# Patient Record
Sex: Male | Born: 1985 | Race: White | Hispanic: No | Marital: Married | Smoking: Never smoker
Health system: Southern US, Community
[De-identification: ages and names within clinical notes are randomized; demographics above are authoritative.]

---

## 2013-12-11 ENCOUNTER — Emergency Department (HOSPITAL_BASED_OUTPATIENT_CLINIC_OR_DEPARTMENT_OTHER)
Admission: EM | Admit: 2013-12-11 | Discharge: 2013-12-11 | Disposition: A | Discharge status: Home / Self Care | Payer: 59 | Attending: Emergency Medicine | Admitting: Emergency Medicine

## 2013-12-11 ENCOUNTER — Encounter (HOSPITAL_BASED_OUTPATIENT_CLINIC_OR_DEPARTMENT_OTHER): Payer: Self-pay | Admitting: Emergency Medicine

## 2013-12-11 DIAGNOSIS — S39012A Strain of muscle, fascia and tendon of lower back, initial encounter: Secondary | ICD-10-CM

## 2013-12-11 DIAGNOSIS — IMO0002 Reserved for concepts with insufficient information to code with codable children: Secondary | ICD-10-CM | POA: Insufficient documentation

## 2013-12-11 DIAGNOSIS — Y929 Unspecified place or not applicable: Secondary | ICD-10-CM | POA: Insufficient documentation

## 2013-12-11 DIAGNOSIS — X500XXA Overexertion from strenuous movement or load, initial encounter: Secondary | ICD-10-CM | POA: Insufficient documentation

## 2013-12-11 DIAGNOSIS — Y9389 Activity, other specified: Secondary | ICD-10-CM | POA: Insufficient documentation

## 2013-12-11 MED ORDER — ONDANSETRON 4 MG PO TBDP
4.0000 mg | ORAL_TABLET | Freq: Once | ORAL | Status: AC
  Administered 2013-12-11: 4 mg via ORAL
  Filled 2013-12-11: qty 1

## 2013-12-11 MED ORDER — HYDROMORPHONE HCL PF 2 MG/ML IJ SOLN
2.0000 mg | Freq: Once | INTRAMUSCULAR | Status: AC
  Administered 2013-12-11: 2 mg via INTRAMUSCULAR
  Filled 2013-12-11: qty 1

## 2013-12-11 MED ORDER — OXYCODONE-ACETAMINOPHEN 5-325 MG PO TABS: 2.0000 | ORAL_TABLET | ORAL | Status: DC | PRN

## 2013-12-11 MED ORDER — IBUPROFEN 800 MG PO TABS: 800.0000 mg | ORAL_TABLET | Freq: Three times a day (TID) | ORAL | Status: AC

## 2013-12-11 MED ORDER — CYCLOBENZAPRINE HCL 10 MG PO TABS: 10.0000 mg | ORAL_TABLET | Freq: Two times a day (BID) | ORAL | Status: DC | PRN

## 2013-12-11 NOTE — ED Notes (Signed)
Pt amb to triage with slow, steady gait in nad. Pt reports he was bent over giving daughter a bath this am, and then couldn't straighten up, low back pain 6/10 increases to 10/10 with ambulation, radiating down left leg.

## 2013-12-11 NOTE — ED Provider Notes (Signed)
CSN: [id number]     Arrival date & time 12/11/13  1704 History   First MD Initiated Contact with Patient 12/11/13 1745     Chief Complaint  Patient presents with  . Back Pain   (Consider location/radiation/quality/duration/timing/severity/associated sxs/prior Treatment) HPI Comments: Patient presents with back pain. He states he was bending over to get his daughter a bath and had a sudden onset of pain in his left lower back. The pain is nonradiating. The triage nurse says it radiates down his left leg but he says it stays right in his back although it's worse with movement of his left leg. He denies any numbness or tingling in his leg. He denies any loss of bowel or bladder control. He denies any perineal numbness. He has had a history of similar problem in the past which resolved on its own after a few days. He states the pain is worse with walking and movement of the left leg.  Patient is a 27 y.o. male presenting with back pain.  Back Pain Associated symptoms: no abdominal pain, no chest pain, no fever, no headaches, no numbness and no weakness     History reviewed. No pertinent past medical history. History reviewed. No pertinent past surgical history. History reviewed. No pertinent family history. History  Substance Use Topics  . Smoking status: Never Smoker   . Smokeless tobacco: Not on file  . Alcohol Use: Not on file    Review of Systems  Constitutional: Negative for fever, chills, diaphoresis and fatigue.  HENT: Negative for congestion, rhinorrhea and sneezing.   Eyes: Negative.   Respiratory: Negative for cough, chest tightness and shortness of breath.   Cardiovascular: Negative for chest pain and leg swelling.  Gastrointestinal: Negative for nausea, vomiting, abdominal pain, diarrhea and blood in stool.  Genitourinary: Negative for frequency, hematuria, flank pain and difficulty urinating.  Musculoskeletal: Positive for back pain. Negative for arthralgias.  Skin:  Negative for rash.  Neurological: Negative for dizziness, speech difficulty, weakness, numbness and headaches.    Allergies  Review of patient's allergies indicates no known allergies.  Home Medications   Current Outpatient Rx  Name  Route  Sig  Dispense  Refill  . HYDROcodone-acetaminophen (NORCO/VICODIN) 5-325 MG per tablet   Oral   Take 1 tablet by mouth every 6 (six) hours as needed for moderate pain.         . cyclobenzaprine (FLEXERIL) 10 MG tablet   Oral   Take 1 tablet (10 mg total) by mouth 2 (two) times daily as needed for muscle spasms.   20 tablet   0   . ibuprofen (ADVIL,MOTRIN) 800 MG tablet   Oral   Take 1 tablet (800 mg total) by mouth 3 (three) times daily.   21 tablet   0   . oxyCODONE-acetaminophen (PERCOCET) 5-325 MG per tablet   Oral   Take 2 tablets by mouth every 4 (four) hours as needed.   20 tablet   0    BP 111/62  Pulse 79  Temp(Src) 98.2 F (36.8 C) (Oral)  Resp 16  Ht 6' (1.829 m)  Wt 150 lb (68.04 kg)  BMI 20.34 kg/m2  SpO2 100% Physical Exam  Constitutional: He is oriented to person, place, and time. He appears well-developed and well-nourished.  HENT:  Head: Normocephalic and atraumatic.  Eyes: Pupils are equal, round, and reactive to light.  Neck: Normal range of motion. Neck supple.  Cardiovascular: Normal rate, regular rhythm and normal heart sounds.   Pulmonary/Chest:  Effort normal and breath sounds normal. No respiratory distress. He has no wheezes. He has no rales. He exhibits no tenderness.  Abdominal: Soft. Bowel sounds are normal. There is no tenderness. There is no rebound and no guarding.  Musculoskeletal: Normal range of motion. He exhibits no edema.  Positive tenderness to the musculature of the left lower back. There's no pain along the spine. Negative straight leg raise bilaterally.   Lymphadenopathy:    He has no cervical adenopathy.  Neurological: He is alert and oriented to person, place, and time.  He has  normal sensation in both legs. Normal motor function in both legs.  Skin: Skin is warm and dry. No rash noted.  Psychiatric: He has a normal mood and affect.    ED Course  Procedures (including critical care time) Labs Review Labs Reviewed - No data to display Imaging Review No results found.  EKG Interpretation   None       MDM   1. Back strain, initial encounter    Patient presents with right-sided low back pain. It seems to be musculoskeletal in nature. There is no suggestions of renal colic. There's no neurologic deficits. He was discharged in good condition. He was given a shot of Dilaudid. He was given prescriptions for ibuprofen Flexeril and Percocet. He was encouraged to followup with Dr. Pearletha Forge who is seen in the past.    Rolan Bucco, MD 12/11/13 [phone]

## 2013-12-16 ENCOUNTER — Emergency Department (HOSPITAL_BASED_OUTPATIENT_CLINIC_OR_DEPARTMENT_OTHER)
Admission: EM | Admit: 2013-12-16 | Discharge: 2013-12-16 | Disposition: A | Discharge status: Home / Self Care | Payer: 59 | Attending: Emergency Medicine | Admitting: Emergency Medicine

## 2013-12-16 ENCOUNTER — Encounter (HOSPITAL_BASED_OUTPATIENT_CLINIC_OR_DEPARTMENT_OTHER): Payer: Self-pay | Admitting: Emergency Medicine

## 2013-12-16 DIAGNOSIS — Z791 Long term (current) use of non-steroidal anti-inflammatories (NSAID): Secondary | ICD-10-CM | POA: Insufficient documentation

## 2013-12-16 DIAGNOSIS — M545 Low back pain, unspecified: Secondary | ICD-10-CM | POA: Insufficient documentation

## 2013-12-16 DIAGNOSIS — M549 Dorsalgia, unspecified: Secondary | ICD-10-CM

## 2013-12-16 MED ORDER — KETOROLAC TROMETHAMINE 60 MG/2ML IM SOLN
60.0000 mg | Freq: Once | INTRAMUSCULAR | Status: AC
  Administered 2013-12-16: 60 mg via INTRAMUSCULAR
  Filled 2013-12-16: qty 2

## 2013-12-16 MED ORDER — CYCLOBENZAPRINE HCL 10 MG PO TABS
10.0000 mg | ORAL_TABLET | Freq: Once | ORAL | Status: AC
Start: 2013-12-16 — End: 2013-12-16
  Administered 2013-12-16: 10 mg via ORAL
  Filled 2013-12-16: qty 1

## 2013-12-16 NOTE — ED Notes (Signed)
Pt was observed ambulating down the hallway with a strong, steady gait with no limitations in movement when undressing for examination.

## 2013-12-16 NOTE — ED Notes (Signed)
Pt seen here on Tuesday for back pain and states its not getting any better even with meds. Pt ambulated to triage with a steady gait. Denies fever, injury, or urinary sxs.

## 2013-12-16 NOTE — ED Provider Notes (Signed)
CSN: [id number]     Arrival date & time 12/16/13  2030 History   First MD Initiated Contact with Patient 12/16/13 2124     Chief Complaint  Patient presents with  . Back Pain   (Consider location/radiation/quality/duration/timing/severity/associated sxs/prior Treatment) HPI Comments: Patient is a 27 year old male who presents with sudden onset of right lower back pain that started 5 days ago when he bent over. The pain is aching and severe and does not radiate. The pain is constant. Movement makes the pain worse. Patient was seen here 5 days ago after the injury occurred and was given pain medication. Pain medication makes the pain better. Pain is improving over the past few days but is still present. No associated symptoms. No saddle paresthesias or bladder/bowel incontinence. Patient is concerned about meningitis. Patient has Ortho follow up tomorrow morning.      History reviewed. No pertinent past medical history. History reviewed. No pertinent past surgical history. History reviewed. No pertinent family history. History  Substance Use Topics  . Smoking status: Never Smoker   . Smokeless tobacco: Not on file  . Alcohol Use: No    Review of Systems  Constitutional: Negative for fever, chills and fatigue.  HENT: Negative for trouble swallowing.   Eyes: Negative for visual disturbance.  Respiratory: Negative for shortness of breath.   Cardiovascular: Negative for chest pain and palpitations.  Gastrointestinal: Negative for nausea, vomiting, abdominal pain and diarrhea.  Genitourinary: Negative for dysuria and difficulty urinating.  Musculoskeletal: Positive for back pain. Negative for arthralgias and neck pain.  Skin: Negative for color change.  Neurological: Negative for dizziness and weakness.  Psychiatric/Behavioral: Negative for dysphoric mood.    Allergies  Review of patient's allergies indicates no known allergies.  Home Medications   Current Outpatient Rx  Name   Route  Sig  Dispense  Refill  . cyclobenzaprine (FLEXERIL) 10 MG tablet   Oral   Take 1 tablet (10 mg total) by mouth 2 (two) times daily as needed for muscle spasms.   20 tablet   0   . ibuprofen (ADVIL,MOTRIN) 800 MG tablet   Oral   Take 1 tablet (800 mg total) by mouth 3 (three) times daily.   21 tablet   0   . oxyCODONE-acetaminophen (PERCOCET) 5-325 MG per tablet   Oral   Take 2 tablets by mouth every 4 (four) hours as needed.   20 tablet   0   . HYDROcodone-acetaminophen (NORCO/VICODIN) 5-325 MG per tablet   Oral   Take 1 tablet by mouth every 6 (six) hours as needed for moderate pain.          BP 127/85  Pulse 80  Temp(Src) 98.4 F (36.9 C) (Oral)  Resp 16  SpO2 100% Physical Exam  Nursing note and vitals reviewed. Constitutional: He is oriented to person, place, and time. He appears well-developed and well-nourished. No distress.  HENT:  Head: Normocephalic and atraumatic.  Eyes: Conjunctivae and EOM are normal.  Neck: Normal range of motion.  Cardiovascular: Normal rate and regular rhythm.  Exam reveals no gallop and no friction rub.   No murmur heard. Pulmonary/Chest: Effort normal and breath sounds normal. He has no wheezes. He has no rales. He exhibits no tenderness.  Abdominal: Soft. He exhibits no distension. There is no tenderness. There is no rebound and no guarding.  Musculoskeletal: Normal range of motion.  No midline spine tenderness to palpation. Right lumbar paraspinal tenderness to palpation.   Neurological: He is  alert and oriented to person, place, and time. Coordination normal.  Patient ambulates without difficulty. Speech is goal-oriented. Moves limbs without ataxia.   Skin: Skin is warm and dry.  Psychiatric: He has a normal mood and affect. His behavior is normal.    ED Course  Procedures (including critical care time) Labs Review Labs Reviewed - No data to display Imaging Review No results found.  EKG Interpretation   None        MDM   1. Back pain     10:39 PM Patient having lumbar muscular pain. Patient is concerned about meningitis although he has no fever or infectious symptoms and had an injury that triggered the patient's pain. Patient has an Orthopedic follow up tomorrow morning. No midline spine tenderness. No bowel/bladder incontinence or saddle paresthesias.     Emilia Beck, PA-C 12/16/13 2340

## 2013-12-17 ENCOUNTER — Encounter: Payer: Self-pay | Admitting: Family Medicine

## 2013-12-17 ENCOUNTER — Ambulatory Visit (INDEPENDENT_AMBULATORY_CARE_PROVIDER_SITE_OTHER): Payer: 59 | Admitting: Family Medicine

## 2013-12-17 VITALS — BP 110/73 | HR 92 | Ht 72.0 in | Wt 150.0 lb

## 2013-12-17 DIAGNOSIS — M545 Low back pain, unspecified: Secondary | ICD-10-CM

## 2013-12-17 MED ORDER — PREDNISONE (PAK) 10 MG PO TABS: ORAL_TABLET | ORAL | Status: AC

## 2013-12-17 MED ORDER — OXYCODONE-ACETAMINOPHEN 5-325 MG PO TABS: 1.0000 | ORAL_TABLET | Freq: Four times a day (QID) | ORAL | Status: AC | PRN

## 2013-12-17 NOTE — ED Provider Notes (Signed)
Medical screening examination/treatment/procedure(s) were performed by non-physician practitioner and as supervising physician I was immediately available for consultation/collaboration.  EKG Interpretation   None         Kristen N Ward, DO 12/17/13 1227 

## 2013-12-17 NOTE — Patient Instructions (Signed)
Your history and exam are consistent with a disc herniation. A prednisone dose pack is the best option for immediate relief and may be prescribed. Day after finishing prednisone start aleve 2 tabs twice a day with food for pain and inflammation. Percocet as needed for severe pain (no driving on this medicine). Stay as active as possible. Physical therapy has been shown to be helpful as well - will consider in the future. Strengthening of low back muscles, abdominal musculature are key for long term pain relief. If not improving, will consider further imaging (MRI). If improving will add physical therapy. Follow up with me in 1 week.

## 2013-12-24 ENCOUNTER — Ambulatory Visit (INDEPENDENT_AMBULATORY_CARE_PROVIDER_SITE_OTHER): Payer: 59 | Admitting: Family Medicine

## 2013-12-24 ENCOUNTER — Ambulatory Visit (HOSPITAL_BASED_OUTPATIENT_CLINIC_OR_DEPARTMENT_OTHER)
Admission: RE | Admit: 2013-12-24 | Discharge: 2013-12-24 | Disposition: A | Discharge status: Home / Self Care | Payer: Managed Care, Other (non HMO) | Source: Ambulatory Visit | Attending: Family Medicine | Admitting: Family Medicine

## 2013-12-24 ENCOUNTER — Encounter: Payer: Self-pay | Admitting: Family Medicine

## 2013-12-24 VITALS — BP 116/71 | HR 88 | Ht 72.0 in | Wt 153.0 lb

## 2013-12-24 DIAGNOSIS — M545 Low back pain, unspecified: Secondary | ICD-10-CM | POA: Insufficient documentation

## 2013-12-24 NOTE — Assessment & Plan Note (Signed)
consistent with disc herniation but without radiculopathy.  Discussed options - he would like to try prednisone dose pack then switch to aleve.  Percocet as needed for severe pain.  Consider PT in future.  F/u in 1 week.  Consider MRI if not improving.

## 2013-12-24 NOTE — Progress Notes (Signed)
Patient ID: Frank Acevedo, male   DOB: 1986/07/19, 27 y.o.   MRN: [id number]  PCP: No PCP Per Patient  Subjective:   HPI: Patient is a 27 y.o. male here for low back pain.  Patient reports he was giving his daughter a bath last Tuesday. Went to get up and felt a sharp pain in low back. Had difficulty getting up after this. Had to crawl on all fours. Couple years ago had similar problem but only lasted a couple days. Has improved some since current injury. No radiation into legs. No bowel/bladder dysfunction. No numbness/tingling. Taking ibuprofen, flexeril, percocet. Iced first couple days. + night pain.  No past medical history on file.  Current Outpatient Prescriptions on File Prior to Visit  Medication Sig Dispense Refill  . ibuprofen (ADVIL,MOTRIN) 800 MG tablet Take 1 tablet (800 mg total) by mouth 3 (three) times daily.  21 tablet  0   No current facility-administered medications on file prior to visit.    No past surgical history on file.  No Known Allergies  History   Social History  . Marital Status: Divorced    Spouse Name: N/A    Number of Children: N/A  . Years of Education: N/A   Occupational History  . Not on file.   Social History Main Topics  . Smoking status: Never Smoker   . Smokeless tobacco: Not on file  . Alcohol Use: No  . Drug Use: No  . Sexual Activity: Not on file   Other Topics Concern  . Not on file   Social History Narrative  . No narrative on file    No family history on file.  BP 110/73  Pulse 92  Ht 6' (1.829 m)  Wt 150 lb (68.04 kg)  BMI 20.34 kg/m2  Review of Systems: See HPI above.    Objective:  Physical Exam:  Gen: NAD  Back: No gross deformity, scoliosis. No paraspinal TTP.  No midline or bony TTP. FROM. Strength LEs 5/5 all muscle groups.   2+ MSRs in patellar and achilles tendons, equal bilaterally. Negative SLRs. Sensation intact to light touch bilaterally. Negative logroll bilateral  hips Negative fabers and piriformis stretches.    Assessment & Plan:  1. Low back pain - consistent with disc herniation but without radiculopathy.  Discussed options - he would like to try prednisone dose pack then switch to aleve.  Percocet as needed for severe pain.  Consider PT in future.  F/u in 1 week.  Consider MRI if not improving.

## 2013-12-24 NOTE — Patient Instructions (Signed)
Get the x=rays before you leave today. We will arrange for an MRI of lumbar spine. I typically call you the business day following the MRI to review results and next steps.

## 2013-12-25 ENCOUNTER — Encounter: Payer: Self-pay | Admitting: Family Medicine

## 2013-12-25 NOTE — Assessment & Plan Note (Signed)
consistent with disc herniation but without radiculopathy.  Unfortunately not improving with prednisone, relative rest.  Getting radiation into groin now with bowel movements.  Discussed options - will move forward with MRI.  Consider formal physical therapy as well, ESIs.

## 2013-12-25 NOTE — Progress Notes (Signed)
Patient ID: Frank Acevedo, male   DOB: Apr 30, 1986, 28 y.o.   MRN: [id number]  PCP: No PCP Per Patient  Subjective:   HPI: Patient is a 27 y.o. male here for low back pain.  12/22: Patient reports he was giving his daughter a bath last Tuesday. Went to get up and felt a sharp pain in low back. Had difficulty getting up after this. Had to crawl on all fours. Couple years ago had similar problem but only lasted a couple days. Has improved some since current injury. No radiation into legs. No bowel/bladder dysfunction. No numbness/tingling. Taking ibuprofen, flexeril, percocet. Iced first couple days. + night pain.  12/29: Patient returns reporting back feels about the same as last visit. Finished prednisone. Taking percocet as needed. Getting pain down into groin area especially with bowel movements. No night pain. No numbness/tingling.  History reviewed. No pertinent past medical history.  Current Outpatient Prescriptions on File Prior to Visit  Medication Sig Dispense Refill  . ibuprofen (ADVIL,MOTRIN) 800 MG tablet Take 1 tablet (800 mg total) by mouth 3 (three) times daily.  21 tablet  0  . oxyCODONE-acetaminophen (PERCOCET) 5-325 MG per tablet Take 1 tablet by mouth every 6 (six) hours as needed.  60 tablet  0  . predniSONE (STERAPRED UNI-PAK) 10 MG tablet 6 tabs po day 1, 5 tabs po day 2, 4 tabs po day 3, 3 tabs po day 4, 2 tabs po day 5, 1 tab po day 6  21 tablet  0   No current facility-administered medications on file prior to visit.    History reviewed. No pertinent past surgical history.  No Known Allergies  History   Social History  . Marital Status: Divorced    Spouse Name: N/A    Number of Children: N/A  . Years of Education: N/A   Occupational History  . Not on file.   Social History Main Topics  . Smoking status: Never Smoker   . Smokeless tobacco: Not on file  . Alcohol Use: No  . Drug Use: No  . Sexual Activity: Not on file   Other Topics  Concern  . Not on file   Social History Narrative  . No narrative on file    History reviewed. No pertinent family history.  BP 116/71  Pulse 88  Ht 6' (1.829 m)  Wt 153 lb (69.4 kg)  BMI 20.75 kg/m2  Review of Systems: See HPI above.    Objective:  Physical Exam:  Gen: NAD  Back: No gross deformity, scoliosis. No paraspinal TTP.  No midline or bony TTP. FROM. Strength LEs 5/5 all muscle groups.   2+ MSRs in patellar and achilles tendons, equal bilaterally. Negative SLRs. Sensation intact to light touch bilaterally. Negative logroll bilateral hips Negative fabers and piriformis stretches.    Assessment & Plan:  1. Low back pain - consistent with disc herniation but without radiculopathy.  Unfortunately not improving with prednisone, relative rest.  Getting radiation into groin now with bowel movements.  Discussed options - will move forward with MRI.  Consider formal physical therapy as well, ESIs.

## 2013-12-26 ENCOUNTER — Other Ambulatory Visit: Payer: Self-pay | Admitting: *Deleted

## 2013-12-26 DIAGNOSIS — M5416 Radiculopathy, lumbar region: Secondary | ICD-10-CM

## 2014-01-03 ENCOUNTER — Ambulatory Visit: Payer: 59 | Attending: Family Medicine | Admitting: Rehabilitation

## 2014-01-03 DIAGNOSIS — M545 Low back pain, unspecified: Secondary | ICD-10-CM | POA: Diagnosis not present

## 2014-01-03 DIAGNOSIS — IMO0001 Reserved for inherently not codable concepts without codable children: Secondary | ICD-10-CM | POA: Diagnosis present

## 2015-04-29 IMAGING — CR DG LUMBAR SPINE 2-3V
3 series · 3 of 3 positions shown · non-contrast
Comparison: None.

CLINICAL DATA: Low back pain

EXAM:
LUMBAR SPINE - 2-3 VIEW

[t l-spine a.p.]
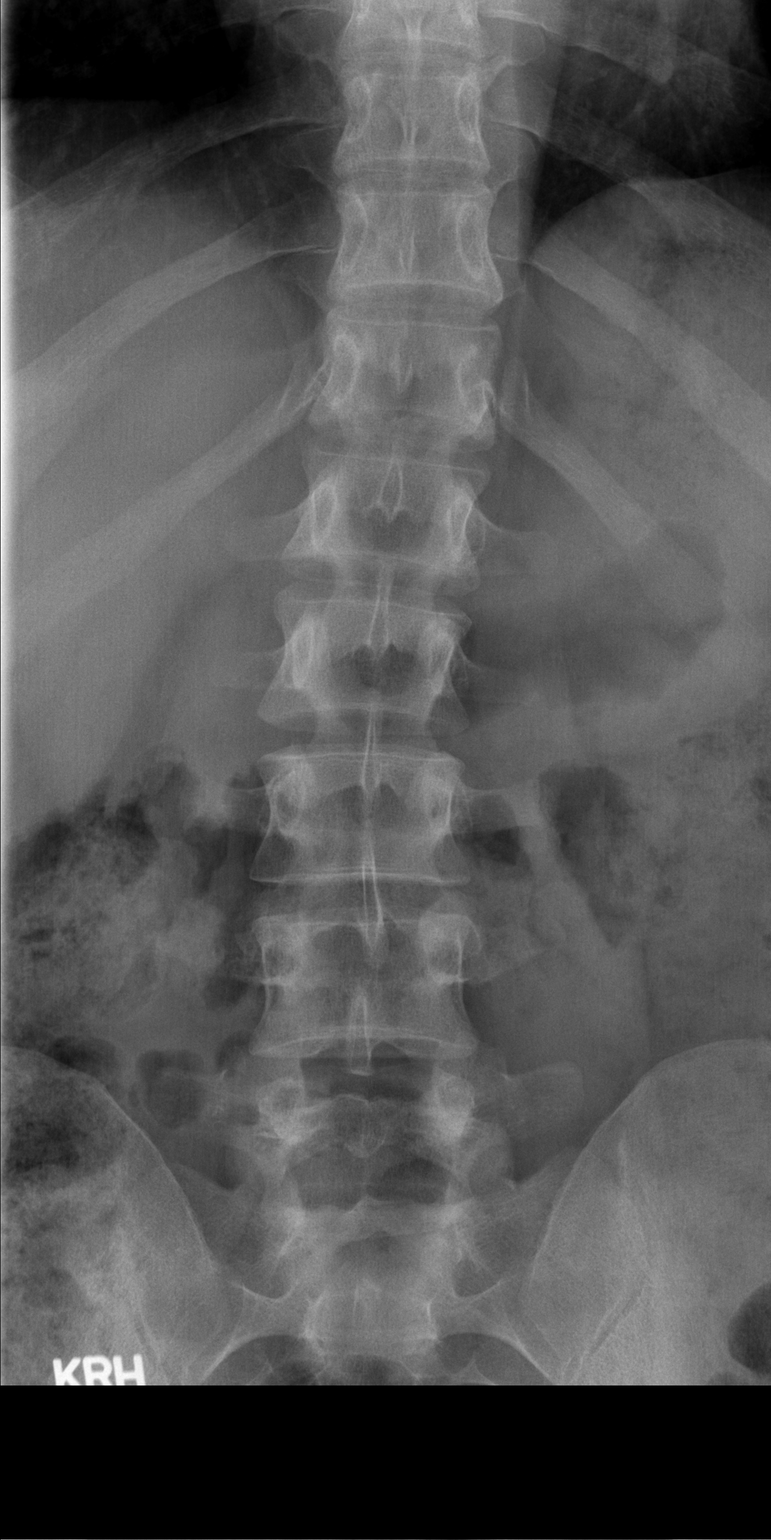

[t l-spine lat]
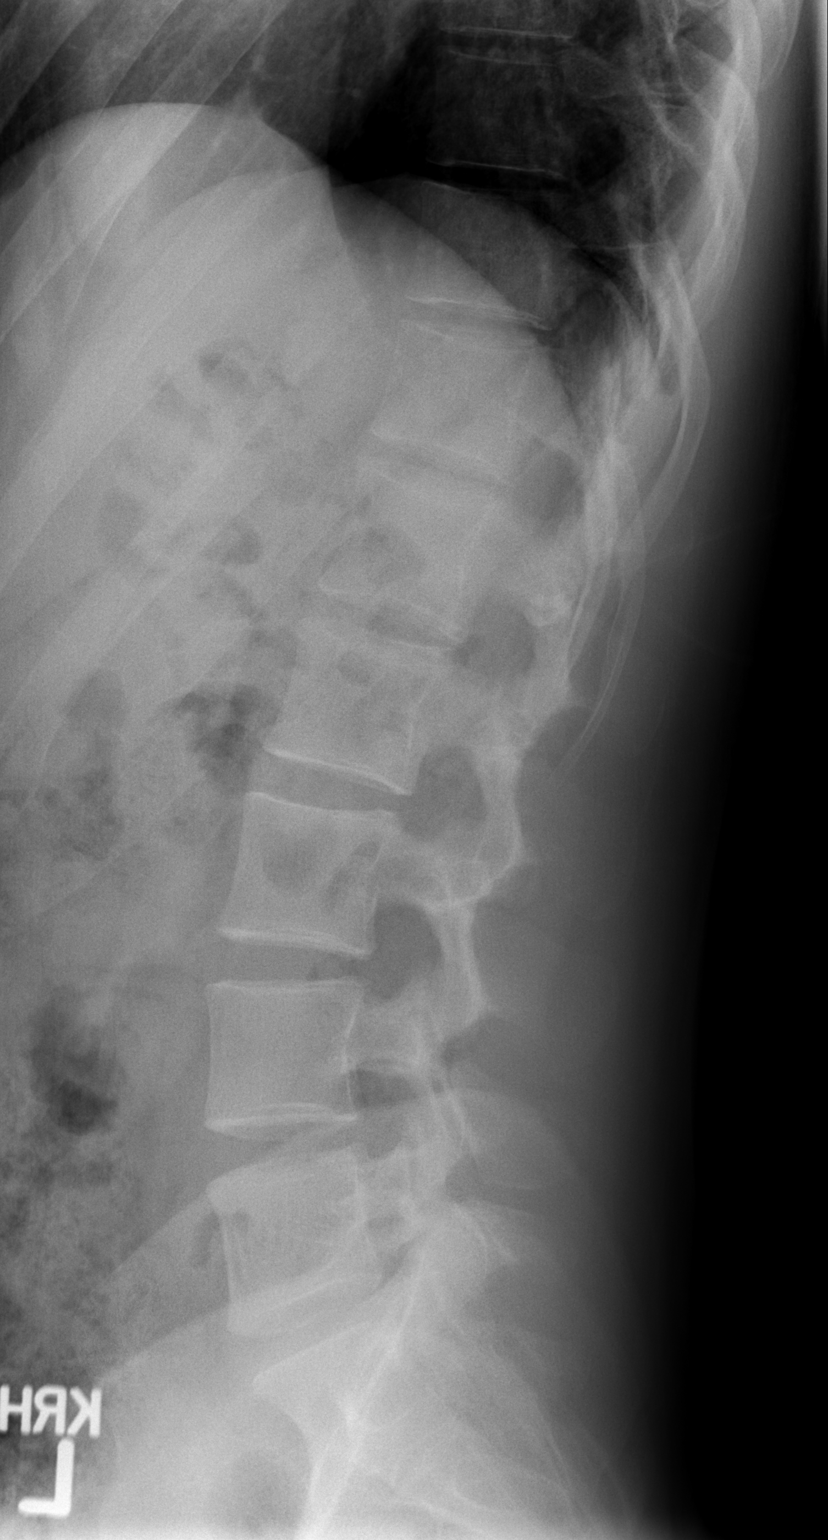

[t l-spine l5-s1 spot]
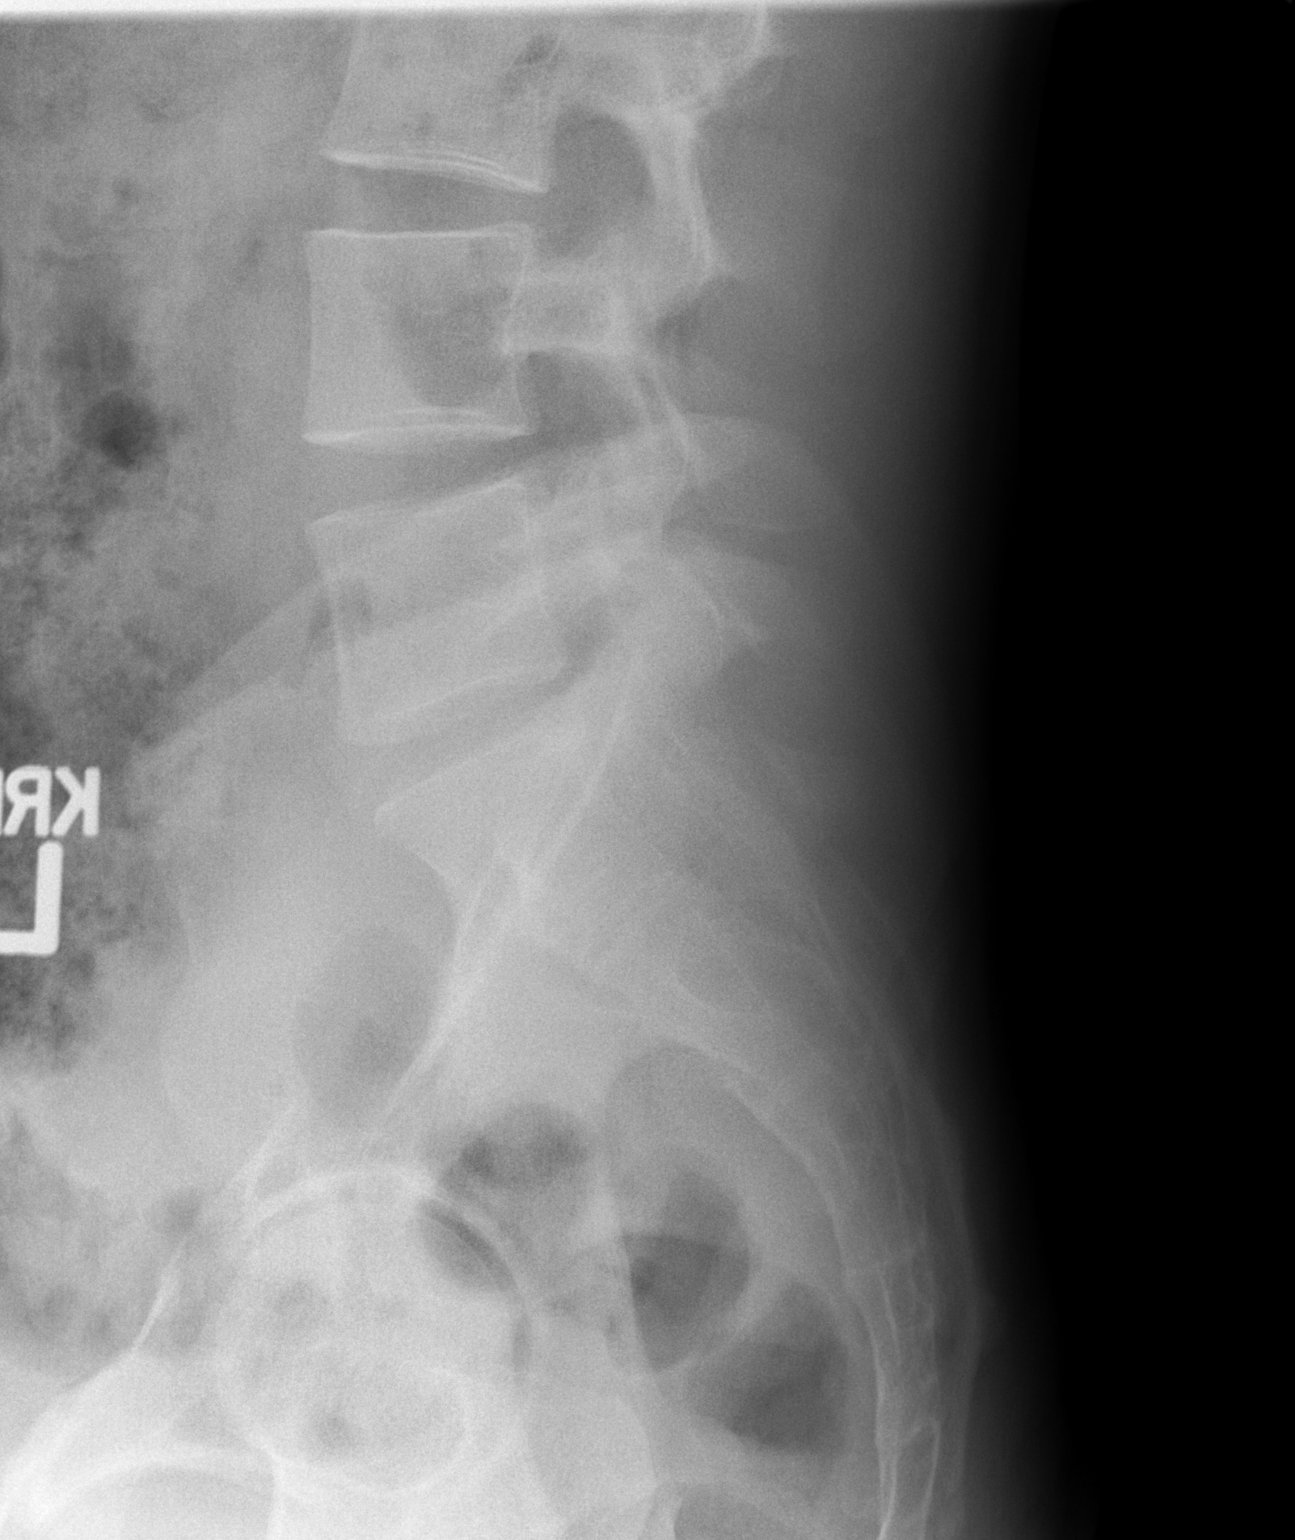

[3 of 3 positions shown; findings below may reference images not displayed]

FINDINGS: There is no evidence of lumbar spine fracture. Alignment is normal.
Intervertebral disc spaces are maintained.
IMPRESSION: No acute abnormality noted.

## 2018-03-10 ENCOUNTER — Emergency Department (HOSPITAL_BASED_OUTPATIENT_CLINIC_OR_DEPARTMENT_OTHER): Payer: Worker's Compensation

## 2018-03-10 ENCOUNTER — Other Ambulatory Visit: Payer: Self-pay

## 2018-03-10 ENCOUNTER — Emergency Department (HOSPITAL_BASED_OUTPATIENT_CLINIC_OR_DEPARTMENT_OTHER)
Admission: EM | Admit: 2018-03-10 | Discharge: 2018-03-10 | Disposition: A | Discharge status: Home / Self Care | Payer: Worker's Compensation | Attending: Emergency Medicine | Admitting: Emergency Medicine

## 2018-03-10 ENCOUNTER — Encounter (HOSPITAL_BASED_OUTPATIENT_CLINIC_OR_DEPARTMENT_OTHER): Payer: Self-pay | Admitting: Emergency Medicine

## 2018-03-10 DIAGNOSIS — Y9389 Activity, other specified: Secondary | ICD-10-CM | POA: Diagnosis not present

## 2018-03-10 DIAGNOSIS — Y990 Civilian activity done for income or pay: Secondary | ICD-10-CM | POA: Diagnosis not present

## 2018-03-10 DIAGNOSIS — S93401A Sprain of unspecified ligament of right ankle, initial encounter: Secondary | ICD-10-CM | POA: Diagnosis not present

## 2018-03-10 DIAGNOSIS — S99911A Unspecified injury of right ankle, initial encounter: Secondary | ICD-10-CM | POA: Diagnosis present

## 2018-03-10 DIAGNOSIS — Y9289 Other specified places as the place of occurrence of the external cause: Secondary | ICD-10-CM | POA: Insufficient documentation

## 2018-03-10 DIAGNOSIS — X509XXA Other and unspecified overexertion or strenuous movements or postures, initial encounter: Secondary | ICD-10-CM | POA: Diagnosis not present

## 2018-03-10 MED ORDER — NAPROXEN 500 MG PO TABS: 500.0000 mg | ORAL_TABLET | Freq: Two times a day (BID) | ORAL | 0 refills | Status: AC

## 2018-03-10 NOTE — ED Triage Notes (Signed)
Patient states that he works at [employer] and on wed night he rolled his right foot. He continues to have pain and was sent from work

## 2018-03-10 NOTE — ED Notes (Signed)
Pt verbalizes understanding of d/c instructions and denies any further needs at this time. 

## 2018-03-10 NOTE — ED Provider Notes (Signed)
[facility] EMERGENCY DEPARTMENT Provider Note   CSN: [id number] Arrival date & time: 03/10/18  1851     History   Chief Complaint Chief Complaint  Patient presents with  . Ankle Pain    HPI Frank Acevedo is a 32 y.o. male who presents to ED after rolling his right ankle at work approximately 2 days ago while climbing down a ladder.  He got his right ankle caught between 2 of the rungs.  He has been ambulatory with pain since.  He has tried icing the area with mild improvement in his symptoms.  Has not taken any medications prior to arrival.  States that most of the pain and swelling is in the lateral malleolus side.  No previous fracture, dislocations or procedures in the area.  Denies any head injuries, numbness in legs, history of gout.  HPI  History reviewed. No pertinent past medical history.  Patient Active Problem List   Diagnosis Date Noted  . Low back pain 12/24/2013    History reviewed. No pertinent surgical history.     Home Medications    Prior to Admission medications   Medication Sig Start Date End Date Taking? Authorizing Provider  ibuprofen (ADVIL,MOTRIN) 800 MG tablet Take 1 tablet (800 mg total) by mouth 3 (three) times daily. 12/11/13   Rolan BuccoBelfi, Melanie, MD  naproxen (NAPROSYN) 500 MG tablet Take 1 tablet (500 mg total) by mouth 2 (two) times daily. 03/10/18   Caylah Plouff, PA-C  oxyCODONE-acetaminophen (PERCOCET) 5-325 MG per tablet Take 1 tablet by mouth every 6 (six) hours as needed. 12/17/13   Hudnall, Azucena FallenShane R, MD  predniSONE (STERAPRED UNI-PAK) 10 MG tablet 6 tabs po day 1, 5 tabs po day 2, 4 tabs po day 3, 3 tabs po day 4, 2 tabs po day 5, 1 tab po day 6 12/17/13   Hudnall, Azucena FallenShane R, MD    Family History History reviewed. No pertinent family history.  Social History Social History   Tobacco Use  . Smoking status: Never Smoker  Substance Use Topics  . Alcohol use: No  . Drug use: No     Allergies   Patient has no known  allergies.   Review of Systems Review of Systems  Constitutional: Negative for chills and fever.  Musculoskeletal: Positive for arthralgias, gait problem and joint swelling. Negative for back pain, myalgias, neck pain and neck stiffness.  Skin: Negative for wound.  Neurological: Negative for syncope and headaches.     Physical Exam Updated Vital Signs BP 126/88 (BP Location: Left Arm)   Pulse 80   Temp 98.1 F (36.7 C) (Oral)   Resp 16   Ht 6' (1.829 m)   Wt 83.9 kg (185 lb)   SpO2 100%   BMI 25.09 kg/m   Physical Exam  Constitutional: He appears well-developed and well-nourished. No distress.  Nontoxic appearing and in no acute distress.  HENT:  Head: Normocephalic and atraumatic.  Eyes: Conjunctivae and EOM are normal. No scleral icterus.  Neck: Normal range of motion.  Pulmonary/Chest: Effort normal. No respiratory distress.  Musculoskeletal: Normal range of motion. He exhibits edema and tenderness. He exhibits no deformity.  Tenderness to palpation at the lateral malleolus of the right ankle.  2+ DP pulse noted.  Able to perform active range of motion of ankle although pain reported.  No visible deformity, erythema noted.  Sensation intact to light touch of bilateral lower extremities.  Neurological: He is alert.  Skin: No rash noted. He is not  diaphoretic.  Psychiatric: He has a normal mood and affect.  Nursing note and vitals reviewed.    ED Treatments / Results  Labs (all labs ordered are listed, but only abnormal results are displayed) Labs Reviewed - No data to display  EKG  EKG Interpretation None       Radiology Dg Ankle Complete Right  Result Date: 03/10/2018 CLINICAL DATA:  32 year old male status post twisting injury 2 days ago. Continued pain and swelling. EXAM: RIGHT ANKLE - COMPLETE 3+ VIEW COMPARISON:  None. FINDINGS: Bone mineralization is within normal limits. Normal mortise joint alignment. Talar dome intact. No joint effusion identified.  No fracture or dislocation identified. IMPRESSION: No acute fracture or dislocation identified about the right ankle. Electronically Signed   By: Odessa Fleming M.D.   On: 03/10/2018 20:17    Procedures Procedures (including critical care time)  Medications Ordered in ED Medications - No data to display   Initial Impression / Assessment and Plan / ED Course  I have reviewed the triage vital signs and the nursing notes.  Pertinent labs & imaging results that were available during my care of the patient were reviewed by me and considered in my medical decision making (see chart for details).     Patient presents to ED after rolling his right ankle 2 days ago while at work and climbing down a ladder.  He reports pain worse with weightbearing.  Denies any previous fracture, dislocations or procedures in the area.  On physical exam he does have mild edema and tenderness to palpation of the lateral malleolus of the right ankle with no visible deformity.  Area is neurovascularly intact.  X-ray was negative for acute fracture dislocation.  Suspect that his symptoms could be due to sprain rather than infectious or vascular cause.  Will give ankle brace, anti-inflammatories and instructions on rice therapy.  Patient appears stable for discharge at this time.  Strict return precautions given.  Portions of this note were generated with Scientist, clinical (histocompatibility and immunogenetics). Dictation errors may occur despite best attempts at proofreading.   Final Clinical Impressions(s) / ED Diagnoses   Final diagnoses:  Sprain of right ankle, unspecified ligament, initial encounter    ED Discharge Orders        Ordered    naproxen (NAPROSYN) 500 MG tablet  2 times daily     03/10/18 2028       Dietrich Pates, PA-C 03/10/18 2033    Gwyneth Sprout, MD 03/12/18 2016

## 2018-03-10 NOTE — Discharge Instructions (Signed)
Please read attached information regarding your condition. Wear ankle brace as directed. Ice the area as directed. Take naproxen as needed for pain and swelling. Follow-up with specialist listed below for further evaluation. Return to ED for worsening symptoms, red hot or tender joint, numbness in legs or additional injuries.

## 2018-07-14 ENCOUNTER — Encounter (HOSPITAL_BASED_OUTPATIENT_CLINIC_OR_DEPARTMENT_OTHER): Payer: Self-pay | Admitting: Emergency Medicine

## 2018-07-14 ENCOUNTER — Emergency Department (HOSPITAL_BASED_OUTPATIENT_CLINIC_OR_DEPARTMENT_OTHER)
Admission: EM | Admit: 2018-07-14 | Discharge: 2018-07-14 | Disposition: A | Discharge status: Home / Self Care | Payer: 59 | Attending: Emergency Medicine | Admitting: Emergency Medicine

## 2018-07-14 ENCOUNTER — Other Ambulatory Visit: Payer: Self-pay

## 2018-07-14 DIAGNOSIS — S71112A Laceration without foreign body, left thigh, initial encounter: Secondary | ICD-10-CM

## 2018-07-14 DIAGNOSIS — S79922A Unspecified injury of left thigh, initial encounter: Secondary | ICD-10-CM | POA: Diagnosis present

## 2018-07-14 DIAGNOSIS — Z79899 Other long term (current) drug therapy: Secondary | ICD-10-CM | POA: Diagnosis not present

## 2018-07-14 DIAGNOSIS — Y929 Unspecified place or not applicable: Secondary | ICD-10-CM | POA: Insufficient documentation

## 2018-07-14 DIAGNOSIS — X58XXXA Exposure to other specified factors, initial encounter: Secondary | ICD-10-CM | POA: Insufficient documentation

## 2018-07-14 DIAGNOSIS — Y999 Unspecified external cause status: Secondary | ICD-10-CM | POA: Diagnosis not present

## 2018-07-14 DIAGNOSIS — Y939 Activity, unspecified: Secondary | ICD-10-CM | POA: Insufficient documentation

## 2018-07-14 MED ORDER — LIDOCAINE HCL 1 % IJ SOLN
INTRAMUSCULAR | Status: AC
  Filled 2018-07-14: qty 20

## 2018-07-14 NOTE — Discharge Instructions (Signed)
Please read and follow all provided instructions.  Your diagnoses today include:  1. Laceration of left thigh, initial encounter     Tests performed today include:  Vital signs. See below for your results today.   Medications prescribed:   None  Take any prescribed medications only as directed.   Home care instructions:  Follow any educational materials and wound care instructions contained in this packet.   Keep affected area above the level of your heart when possible to minimize swelling. Wash area gently twice a day with warm soapy water. Do not apply alcohol or hydrogen peroxide. Cover the area if it draining or weeping.   Follow-up instructions: Suture Removal: Return to the Emergency Department or see your primary care care doctor in 10 days for a recheck of your wound and removal of your sutures or staples.    Return instructions:  Return to the Emergency Department if you have:  Fever  Worsening pain  Worsening swelling of the wound  Pus draining from the wound  Redness of the skin that moves away from the wound, especially if it streaks away from the affected area   Any other emergent concerns  Your vital signs today were: BP (!) 130/103    Pulse 85    Temp 98.3 F (36.8 C) (Oral)    Resp 16    Ht 5\' 11"  (1.803 m)    Wt 86.2 kg (190 lb)    SpO2 99%    BMI 26.50 kg/m  If your blood pressure (BP) was elevated above 135/85 this visit, please have this repeated by your doctor within one month. --------------

## 2018-07-14 NOTE — ED Provider Notes (Signed)
[facility] EMERGENCY DEPARTMENT Provider Note   CSN: [id number] Arrival date & time: 07/14/18  1841     History   Chief Complaint Chief Complaint  Patient presents with  . Extremity Laceration    HPI Frank Acevedo is a 32 y.o. male.  Patient presents the emergency department with complaint of left thigh laceration which occurred when he was taking out a mirror.  This occurred just prior to arrival.  No treatments prior to arrival.  Tetanus up-to-date.  Patient denies any other injuries.  Onset of symptoms acute.  Course is constant.  Nothing makes symptoms better or worse.     History reviewed. No pertinent past medical history.  Patient Active Problem List   Diagnosis Date Noted  . Low back pain 12/24/2013    History reviewed. No pertinent surgical history.      Home Medications    Prior to Admission medications   Medication Sig Start Date End Date Taking? Authorizing Provider  ibuprofen (ADVIL,MOTRIN) 800 MG tablet Take 1 tablet (800 mg total) by mouth 3 (three) times daily. 12/11/13   Rolan BuccoBelfi, Melanie, MD  naproxen (NAPROSYN) 500 MG tablet Take 1 tablet (500 mg total) by mouth 2 (two) times daily. 03/10/18   Khatri, Hina, PA-C  oxyCODONE-acetaminophen (PERCOCET) 5-325 MG per tablet Take 1 tablet by mouth every 6 (six) hours as needed. 12/17/13   Hudnall, Azucena FallenShane R, MD  predniSONE (STERAPRED UNI-PAK) 10 MG tablet 6 tabs po day 1, 5 tabs po day 2, 4 tabs po day 3, 3 tabs po day 4, 2 tabs po day 5, 1 tab po day 6 12/17/13   Hudnall, Azucena FallenShane R, MD    Family History History reviewed. No pertinent family history.  Social History Social History   Tobacco Use  . Smoking status: Never Smoker  Substance Use Topics  . Alcohol use: No  . Drug use: No     Allergies   Patient has no known allergies.   Review of Systems Review of Systems  Constitutional: Negative for activity change.  Musculoskeletal: Positive for myalgias. Negative for arthralgias, back  pain and neck pain.  Skin: Positive for wound.  Neurological: Negative for weakness and numbness.     Physical Exam Updated Vital Signs BP (!) 130/103   Pulse 85   Temp 98.3 F (36.8 C) (Oral)   Resp 16   Ht 5\' 11"  (1.803 m)   Wt 86.2 kg (190 lb)   SpO2 99%   BMI 26.50 kg/m   Physical Exam  Constitutional: He appears well-developed and well-nourished.  HENT:  Head: Normocephalic and atraumatic.  Eyes: Conjunctivae are normal.  Neck: Normal range of motion. Neck supple.  Cardiovascular: Normal pulses. Exam reveals no decreased pulses.  Musculoskeletal: He exhibits tenderness. He exhibits no edema.  Patient with 1 cm laceration to the distal anterior left thigh.  Wound is superficial.  Mildly gaping.  No foreign bodies visualized.  No deeper structure involvement.  Neurological: He is alert. No sensory deficit.  Motor, sensation, and vascular distal to the injury is fully intact.   Skin: Skin is warm and dry.  Psychiatric: He has a normal mood and affect.  Nursing note and vitals reviewed.    ED Treatments / Results  Labs (all labs ordered are listed, but only abnormal results are displayed) Labs Reviewed - No data to display  EKG None  Radiology No results found.  Procedures .Marland Kitchen.Laceration Repair Date/Time: 07/14/2018 8:48 PM Performed by: Renne CriglerGeiple, Rhayne Chatwin, PA-C Authorized by: Emmit AlexandersGeiple,  Ivin Booty, PA-C   Consent:    Consent obtained:  Verbal   Consent given by:  Patient   Risks discussed:  Infection and pain   Alternatives discussed: tissue adhesive. Anesthesia (see MAR for exact dosages):    Anesthesia method:  Local infiltration   Local anesthetic:  Lidocaine 1% w/o epi Laceration details:    Location:  Leg   Leg location:  L upper leg   Length (cm):  1 Repair type:    Repair type:  Simple Pre-procedure details:    Preparation:  Patient was prepped and draped in usual sterile fashion Exploration:    Wound exploration: wound explored through full range of  motion and entire depth of wound probed and visualized     Contaminated: no   Treatment:    Area cleansed with:  Betadine   Amount of cleaning:  Standard Skin repair:    Repair method:  Sutures   Suture size:  5-0   Suture material:  Prolene   Suture technique:  Simple interrupted   Number of sutures:  3 Approximation:    Approximation:  Close Post-procedure details:    Dressing:  Open (no dressing)   Patient tolerance of procedure:  Tolerated well, no immediate complications   (including critical care time)  Medications Ordered in ED Medications  lidocaine (XYLOCAINE) 1 % (with pres) injection (has no administration in time range)     Initial Impression / Assessment and Plan / ED Course  I have reviewed the triage vital signs and the nursing notes.  Pertinent labs & imaging results that were available during my care of the patient were reviewed by me and considered in my medical decision making (see chart for details).     Patient seen and examined.  Discussed wound repair with glue versus sutures.  Patient would prefer to have the area sutured.  Vital signs reviewed and are as follows: BP (!) 130/103   Pulse 85   Temp 98.3 F (36.8 C) (Oral)   Resp 16   Ht 5\' 11"  (1.803 m)   Wt 86.2 kg (190 lb)   SpO2 99%   BMI 26.50 kg/m   Patient counseled on wound care. Patient counseled on need to return or see PCP/urgent care for suture removal in 10 days. Patient was urged to return to the Emergency Department urgently with worsening pain, swelling, expanding erythema especially if it streaks away from the affected area, fever, or if they have any other concerns. Patient verbalized understanding.    Final Clinical Impressions(s) / ED Diagnoses   Final diagnoses:  Laceration of left thigh, initial encounter   Patient with small thigh laceration, repaired without complication.  No deeper structure involvement suspected.  Wound is clean.  ED Discharge Orders    None        Renne Crigler, Cordelia Poche 07/14/18 2100    Melene Plan, DO 07/14/18 2307

## 2018-07-14 NOTE — ED Notes (Signed)
Patient offered xray in triage.  Patient states he does not want one at present and would like to wait to see provider.

## 2018-07-14 NOTE — ED Triage Notes (Signed)
Patient reports left leg laceration trying to take a mirror down.  Reports tetanus UTD.

## 2019-07-14 IMAGING — CR DG ANKLE COMPLETE 3+V*R*
3 series · 3 of 3 positions shown · non-contrast
Comparison: None.

CLINICAL DATA: 31-year-old male status post twisting injury 2 days
ago. Continued pain and swelling.

EXAM:
RIGHT ANKLE - COMPLETE 3+ VIEW

[t ankle joint ap right]
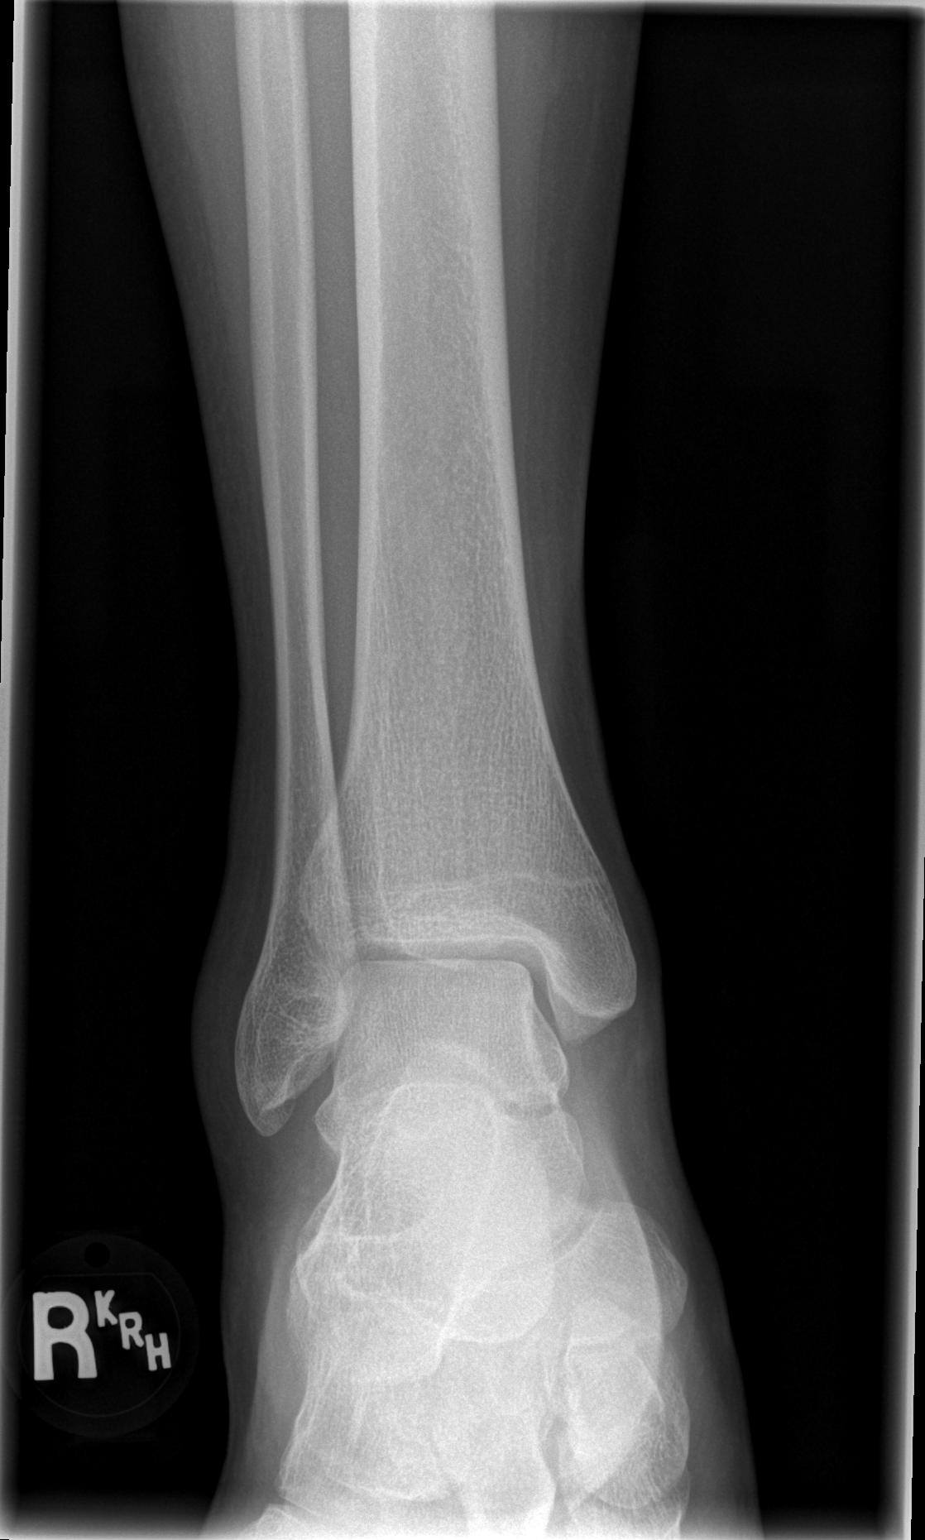

[t ankle joint oblique right]
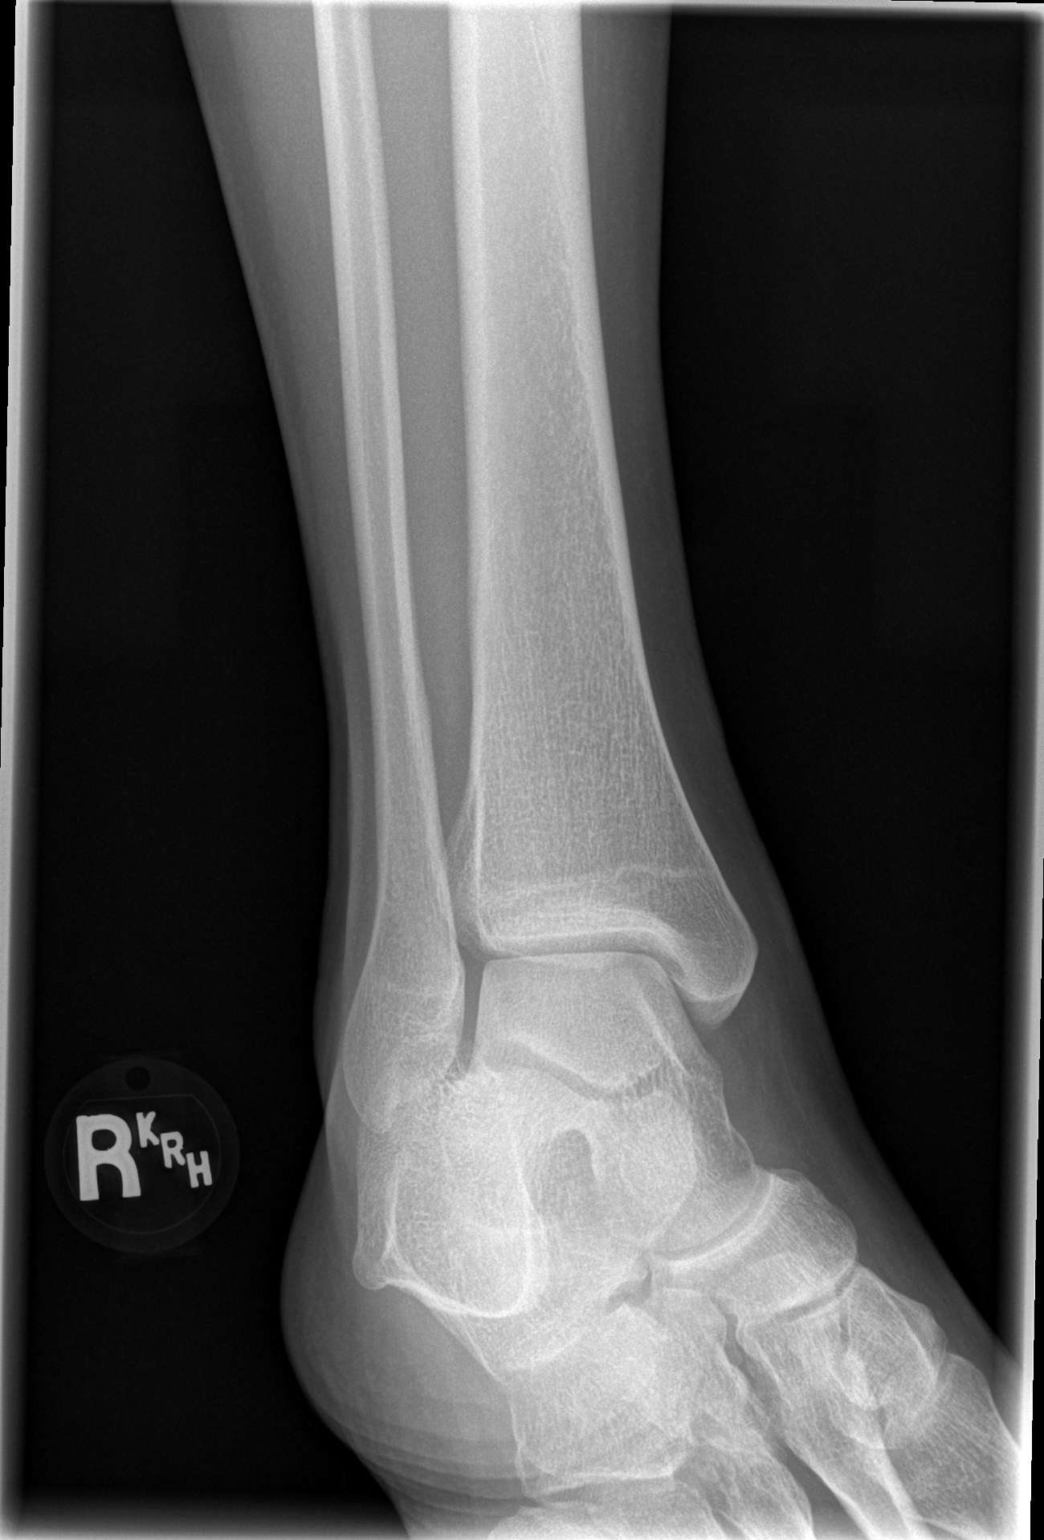

[t ankle joint lat right]
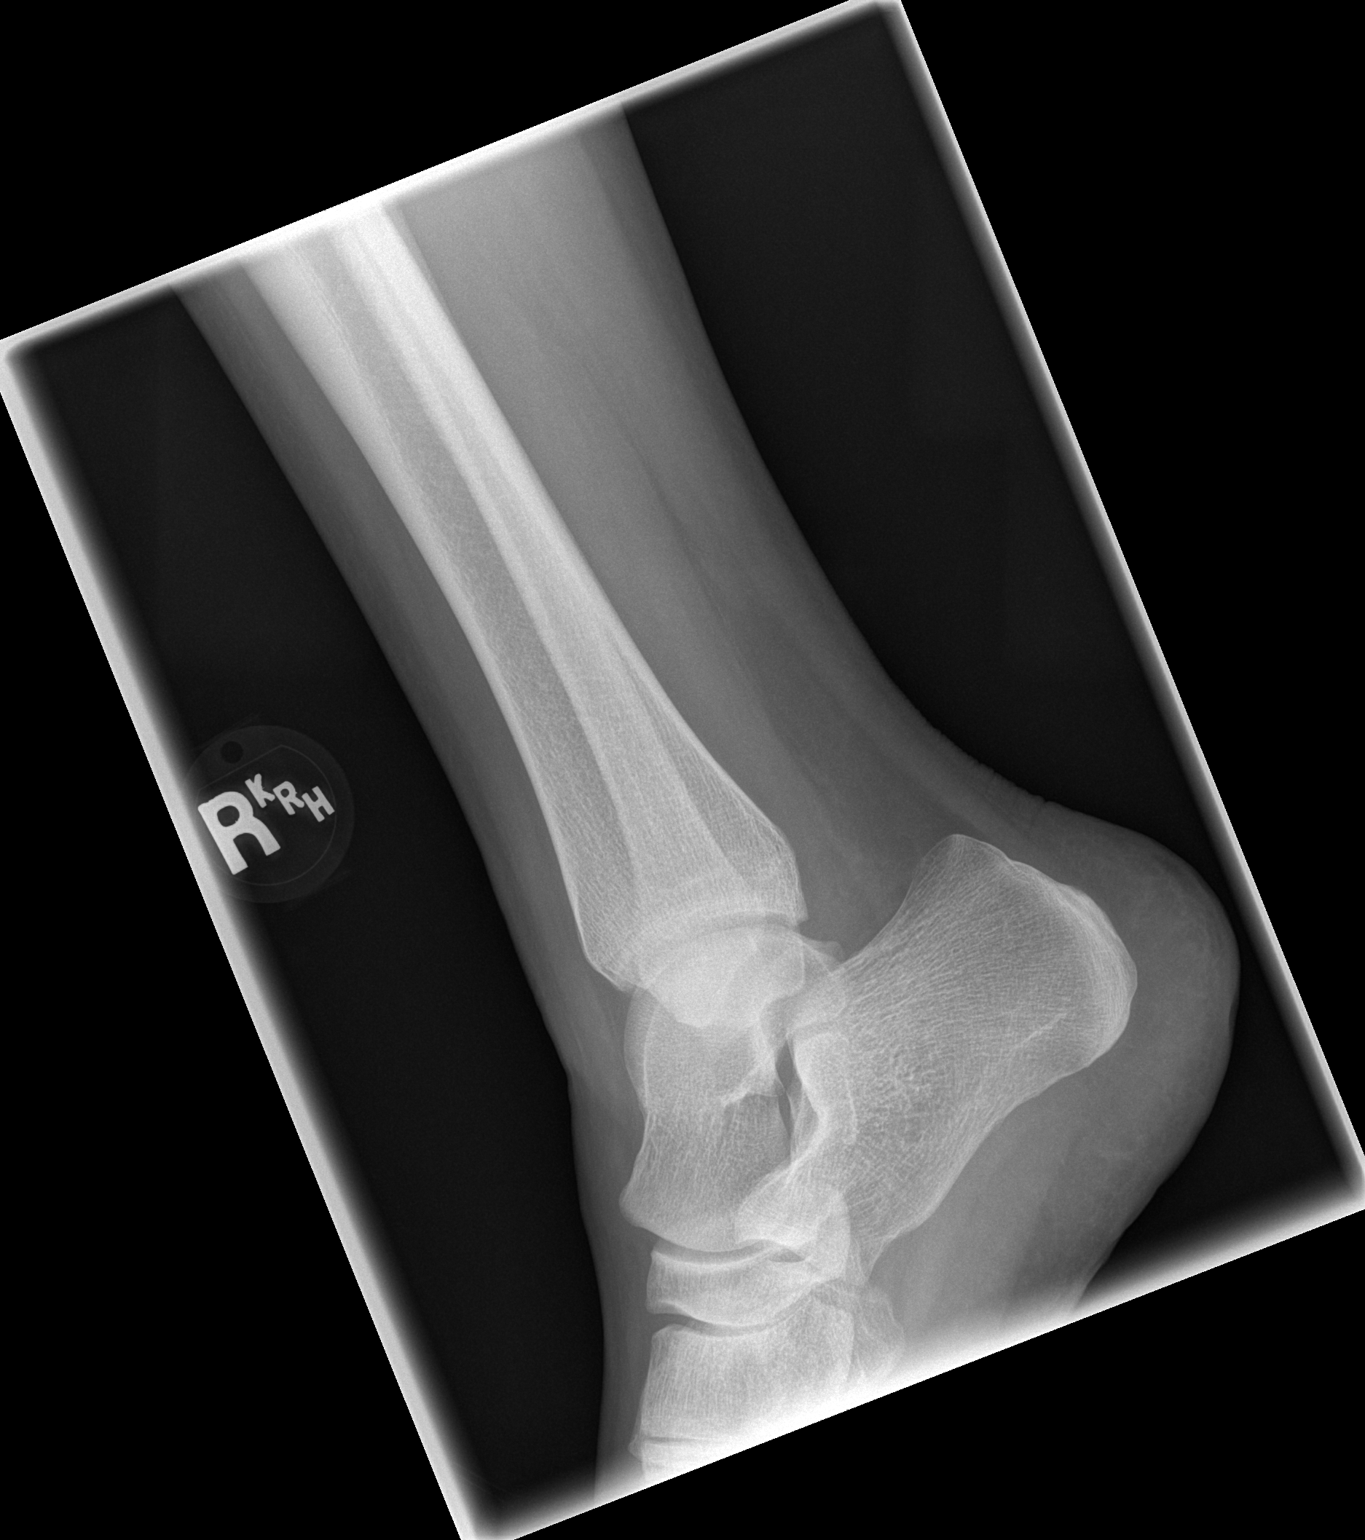

[3 of 3 positions shown; findings below may reference images not displayed]

FINDINGS: Bone mineralization is within normal limits. Normal mortise joint
alignment. Talar dome intact. No joint effusion identified. No
fracture or dislocation identified.
IMPRESSION: No acute fracture or dislocation identified about the right ankle.
